# Patient Record
Sex: Male | Born: 1970 | Hispanic: Yes | Marital: Married | Smoking: Never smoker
Health system: Southern US, Community
[De-identification: ages and names within clinical notes are randomized; demographics above are authoritative.]

## PROBLEM LIST (undated history)

## (undated) DIAGNOSIS — E785 Hyperlipidemia, unspecified: Secondary | ICD-10-CM

## (undated) DIAGNOSIS — I10 Essential (primary) hypertension: Secondary | ICD-10-CM

---

## 2015-07-01 ENCOUNTER — Emergency Department (HOSPITAL_COMMUNITY)
Admission: EM | Admit: 2015-07-01 | Discharge: 2015-07-02 | Disposition: A | Discharge status: Home / Self Care | Payer: Self-pay | Attending: Emergency Medicine | Admitting: Emergency Medicine

## 2015-07-01 ENCOUNTER — Emergency Department (HOSPITAL_COMMUNITY): Payer: Self-pay

## 2015-07-01 ENCOUNTER — Encounter (HOSPITAL_COMMUNITY): Payer: Self-pay | Admitting: *Deleted

## 2015-07-01 DIAGNOSIS — Z8639 Personal history of other endocrine, nutritional and metabolic disease: Secondary | ICD-10-CM | POA: Insufficient documentation

## 2015-07-01 DIAGNOSIS — K219 Gastro-esophageal reflux disease without esophagitis: Secondary | ICD-10-CM | POA: Insufficient documentation

## 2015-07-01 DIAGNOSIS — I10 Essential (primary) hypertension: Secondary | ICD-10-CM | POA: Insufficient documentation

## 2015-07-01 HISTORY — DX: Hyperlipidemia, unspecified: E78.5

## 2015-07-01 HISTORY — DX: Essential (primary) hypertension: I10

## 2015-07-01 LAB — BASIC METABOLIC PANEL
Anion gap: 8 (ref 5–15)
BUN: 13 mg/dL (ref 6–20)
CHLORIDE: 105 mmol/L (ref 101–111)
CO2: 27 mmol/L (ref 22–32)
Calcium: 9.4 mg/dL (ref 8.9–10.3)
Creatinine, Ser: 0.81 mg/dL (ref 0.61–1.24)
GFR calc non Af Amer: >60 mL/min (ref >60)
Glucose, Bld: 112 mg/dL — ABNORMAL HIGH (ref 65–99)
POTASSIUM: 3.8 mmol/L (ref 3.5–5.1)
SODIUM: 140 mmol/L (ref 135–145)

## 2015-07-01 LAB — I-STAT TROPONIN, ED
TROPONIN I, POC: 0 ng/mL (ref 0.00–0.08)
Troponin i, poc: 0 ng/mL (ref 0.00–0.08)

## 2015-07-01 LAB — CBC
HEMATOCRIT: 45 % (ref 39.0–52.0)
HEMOGLOBIN: 15.7 g/dL (ref 13.0–17.0)
MCH: 32 pg (ref 26.0–34.0)
MCHC: 34.9 g/dL (ref 30.0–36.0)
MCV: 91.8 fL (ref 78.0–100.0)
Platelets: 195 10*3/uL (ref 150–400)
RBC: 4.9 MIL/uL (ref 4.22–5.81)
RDW: 12.2 % (ref 11.5–15.5)
WBC: 7 10*3/uL (ref 4.0–10.5)

## 2015-07-01 MED ORDER — PANTOPRAZOLE SODIUM 20 MG PO TBEC
20.0000 mg | DELAYED_RELEASE_TABLET | Freq: Every day | ORAL | Status: DC
Start: 2015-07-01 — End: 2017-07-06

## 2015-07-01 MED ORDER — GI COCKTAIL ~~LOC~~: 30.0000 mL | Freq: Two times a day (BID) | ORAL | Status: DC | PRN

## 2015-07-01 MED ORDER — GI COCKTAIL ~~LOC~~
30.0000 mL | Freq: Once | ORAL | Status: AC
  Administered 2015-07-01: 30 mL via ORAL
  Filled 2015-07-01: qty 30

## 2015-07-01 NOTE — ED Notes (Signed)
Pt c/o CP x 3 days with radiation to left arm and back.

## 2015-07-01 NOTE — ED Provider Notes (Signed)
CSN: [id number]     Arrival date & time 07/01/15  1858 History   First MD Initiated Contact with Patient 07/01/15 2221     Chief Complaint  Patient presents with  . Chest Pain     (Consider location/radiation/quality/duration/timing/severity/associated sxs/prior Treatment) Patient is a 44 y.o. male presenting with chest pain. The history is provided by the patient. A language interpreter was used.  Chest Pain Pain location:  Substernal area and L chest Pain quality: burning and sharp   Pain radiates to:  Upper back and L arm Pain radiates to the back: yes   Pain severity:  Moderate Onset quality:  Sudden Duration:  1 minute Timing:  Intermittent Progression:  Unchanged Chronicity:  New Context: eating   Relieved by:  Nothing Exacerbated by: spicy foods and coffee. Ineffective treatments:  None tried Associated symptoms: no abdominal pain, no altered mental status, no anxiety, no diaphoresis, no dizziness, no dysphagia, no fatigue, no headache, no near-syncope and no numbness   Risk factors: high cholesterol, hypertension and male sex   Risk factors: no aortic disease, no diabetes mellitus, not obese, no prior DVT/PE and no smoking     Past Medical History  Diagnosis Date  . Hypertension   . Hyperlipidemia    History reviewed. No pertinent past surgical history. No family history on file. Social History  Substance Use Topics  . Smoking status: Never Smoker   . Smokeless tobacco: None  . Alcohol Use: Yes     Comment: 4 beers daily    Review of Systems  Constitutional: Negative for diaphoresis and fatigue.  HENT: Negative for trouble swallowing.   Cardiovascular: Positive for chest pain. Negative for near-syncope.  Gastrointestinal: Negative for abdominal pain.  Neurological: Negative for dizziness, numbness and headaches.  All other systems reviewed and are negative.     Allergies  Review of patient's allergies indicates no known allergies.  Home Medications    Prior to Admission medications   Not on File   BP 132/74 mmHg  Pulse 76  Temp(Src) 98.6 F (37 C) (Oral)  Resp 16  SpO2 98% Physical Exam  Constitutional: He is oriented to person, place, and time. He appears well-developed and well-nourished. No distress.  HENT:  Head: Normocephalic and atraumatic.  Eyes: Conjunctivae and EOM are normal.  Neck: Normal range of motion.  Cardiovascular: Normal rate and regular rhythm.  Exam reveals no gallop and no friction rub.   No murmur heard. No lower extremity edema or calf tenderness to palpation.   Pulmonary/Chest: Effort normal and breath sounds normal. He has no wheezes. He has no rales. He exhibits no tenderness.  Abdominal: Soft. He exhibits no distension. There is no tenderness. There is no rebound.  Musculoskeletal: Normal range of motion.  Neurological: He is alert and oriented to person, place, and time. Coordination normal.  Speech is goal-oriented. Moves limbs without ataxia.   Skin: Skin is warm and dry.  Psychiatric: He has a normal mood and affect. His behavior is normal.  Nursing note and vitals reviewed.   ED Course  Procedures (including critical care time) Labs Review Labs Reviewed  BASIC METABOLIC PANEL - Abnormal; Notable for the following:    Glucose, Bld 112 (*)    All other components within normal limits  CBC  I-STAT TROPOININ, ED  Rosezena Sensor, ED    Imaging Review Dg Chest 2 View  07/01/2015   CLINICAL DATA:  Three day history of left-sided chest pain radiating to the back associated with  headache, dizziness and blurred vision.  EXAM: CHEST  2 VIEW  COMPARISON:  None.  FINDINGS: Suboptimal inspiration accounts for crowded bronchovascular markings, especially in the bases, and accentuates the cardiac silhouette. Taking this into account, cardiomediastinal silhouette unremarkable. Lungs clear. Bronchovascular markings normal. Pulmonary vascularity normal. No visible pleural effusions. No pneumothorax.  Visualized bony thorax intact.  IMPRESSION: Suboptimal inspiration.  No acute cardiopulmonary disease.   Electronically Signed   By: Hulan Saas M.D.   On: 07/01/2015 19:57   I have personally reviewed and evaluated these images and lab results as part of my medical decision-making.   EKG Interpretation   Date/Time:  Tuesday July 01 2015 19:01:58 EDT Ventricular Rate:  78 PR Interval:  158 QRS Duration: 98 QT Interval:  376 QTC Calculation: 428 R Axis:   73 Text Interpretation:  Normal sinus rhythm Normal ECG No old tracing to  compare Confirmed by [facility]  MD, MARTHA [phone]) on 07/01/2015 10:33:33 PM      MDM   Final diagnoses:  Gastroesophageal reflux disease, esophagitis presence not specified    11:19 PM Patient's labs unremarkable for acute changes. Patient's delta troponin negative. HEART score 2. Patient's history does not sound like cardiac chest pain. Pain appears to be acid reflux. Patient treated with GI cocktail. Patient will be discharged with a list of foods to avoid, GI cocktail, and protonix.    Emilia Beck, PA-C 07/01/15 6045  Jerelyn Scott, MD 07/01/15 [phone]

## 2015-07-01 NOTE — Discharge Instructions (Signed)
Take Protonix daily for your acid reflux. Take GI cocktail as needed for additional relief. Refer to attached documents for more information.

## 2016-08-02 IMAGING — DX DG CHEST 2V
2 series · 2 of 2 positions shown · non-contrast
Comparison: None.

CLINICAL DATA: Three day history of left-sided chest pain radiating
to the back associated with headache, dizziness and blurred vision.

EXAM:
CHEST  2 VIEW

[chest pa]
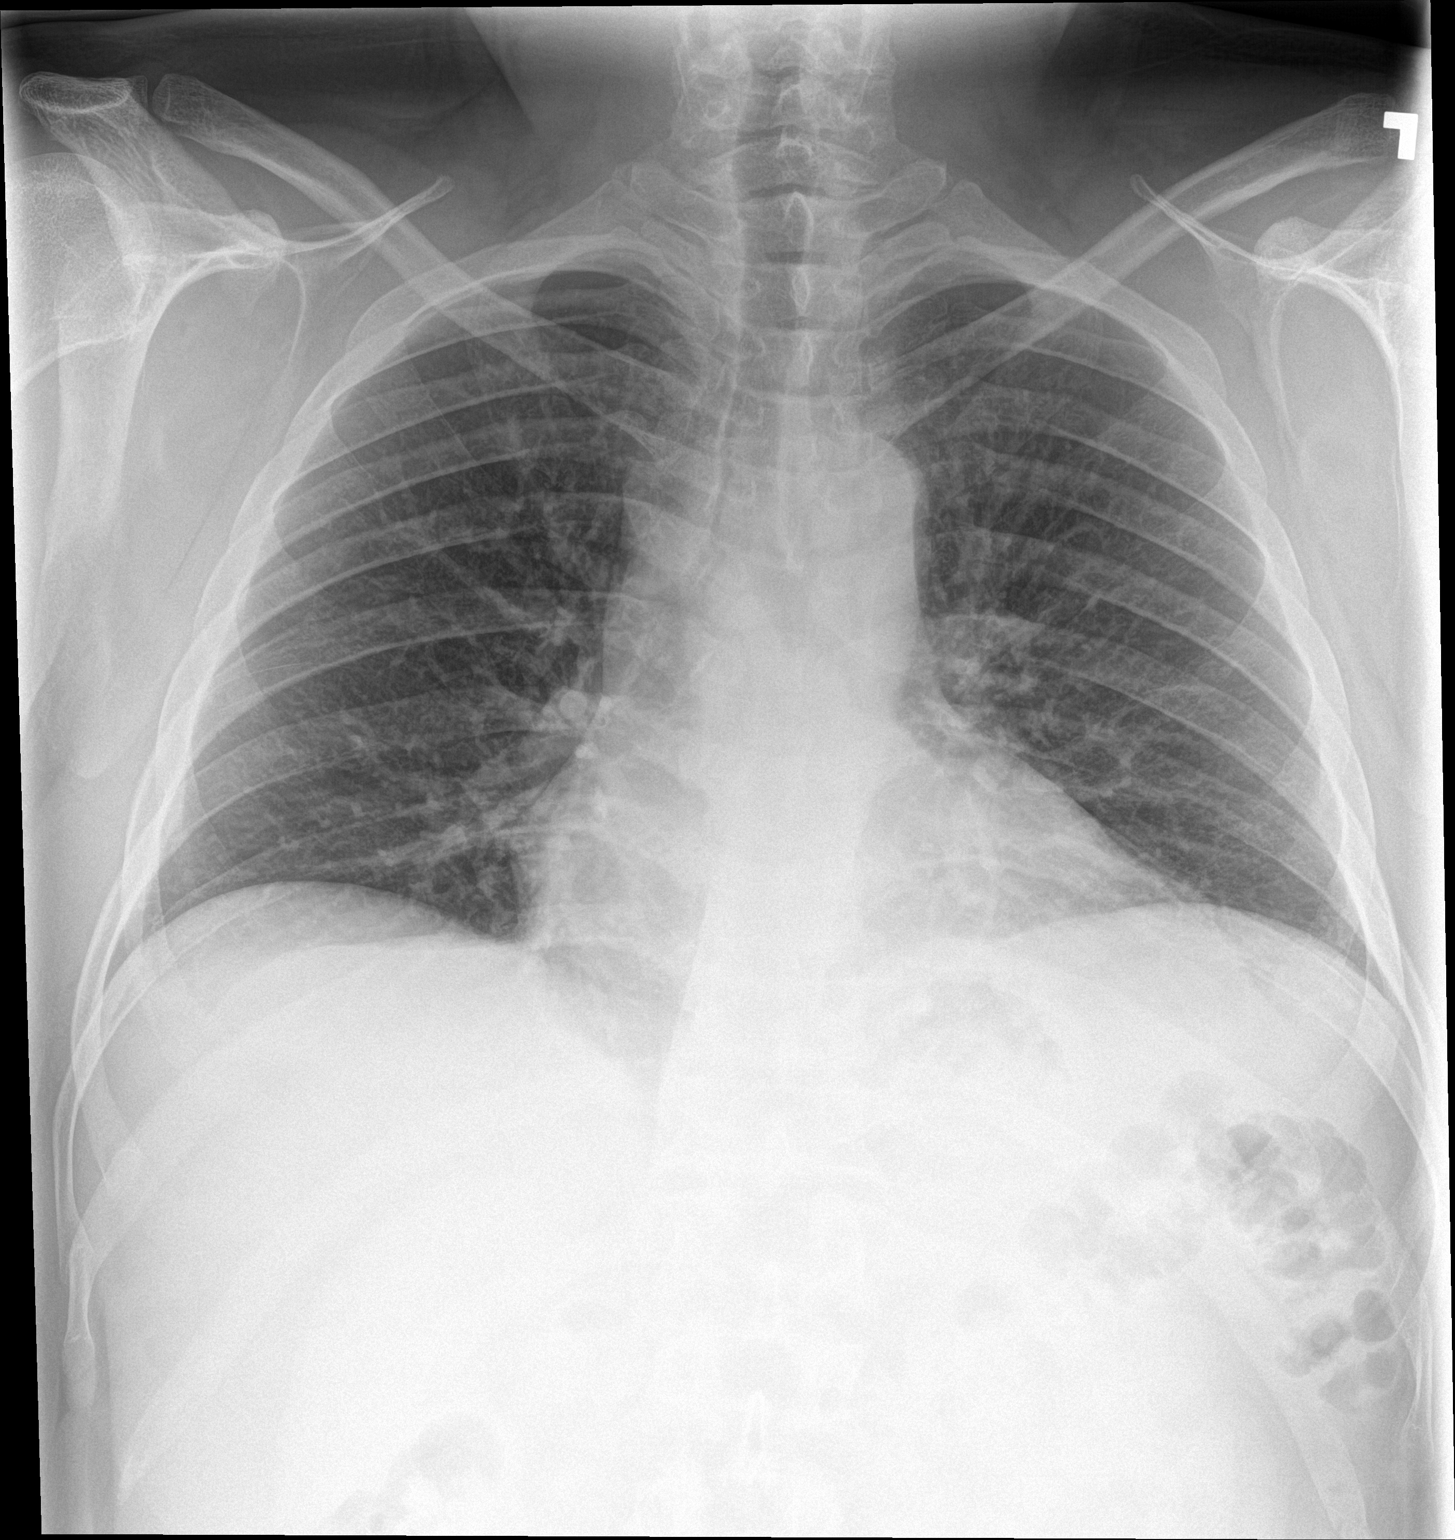

[chest lat]
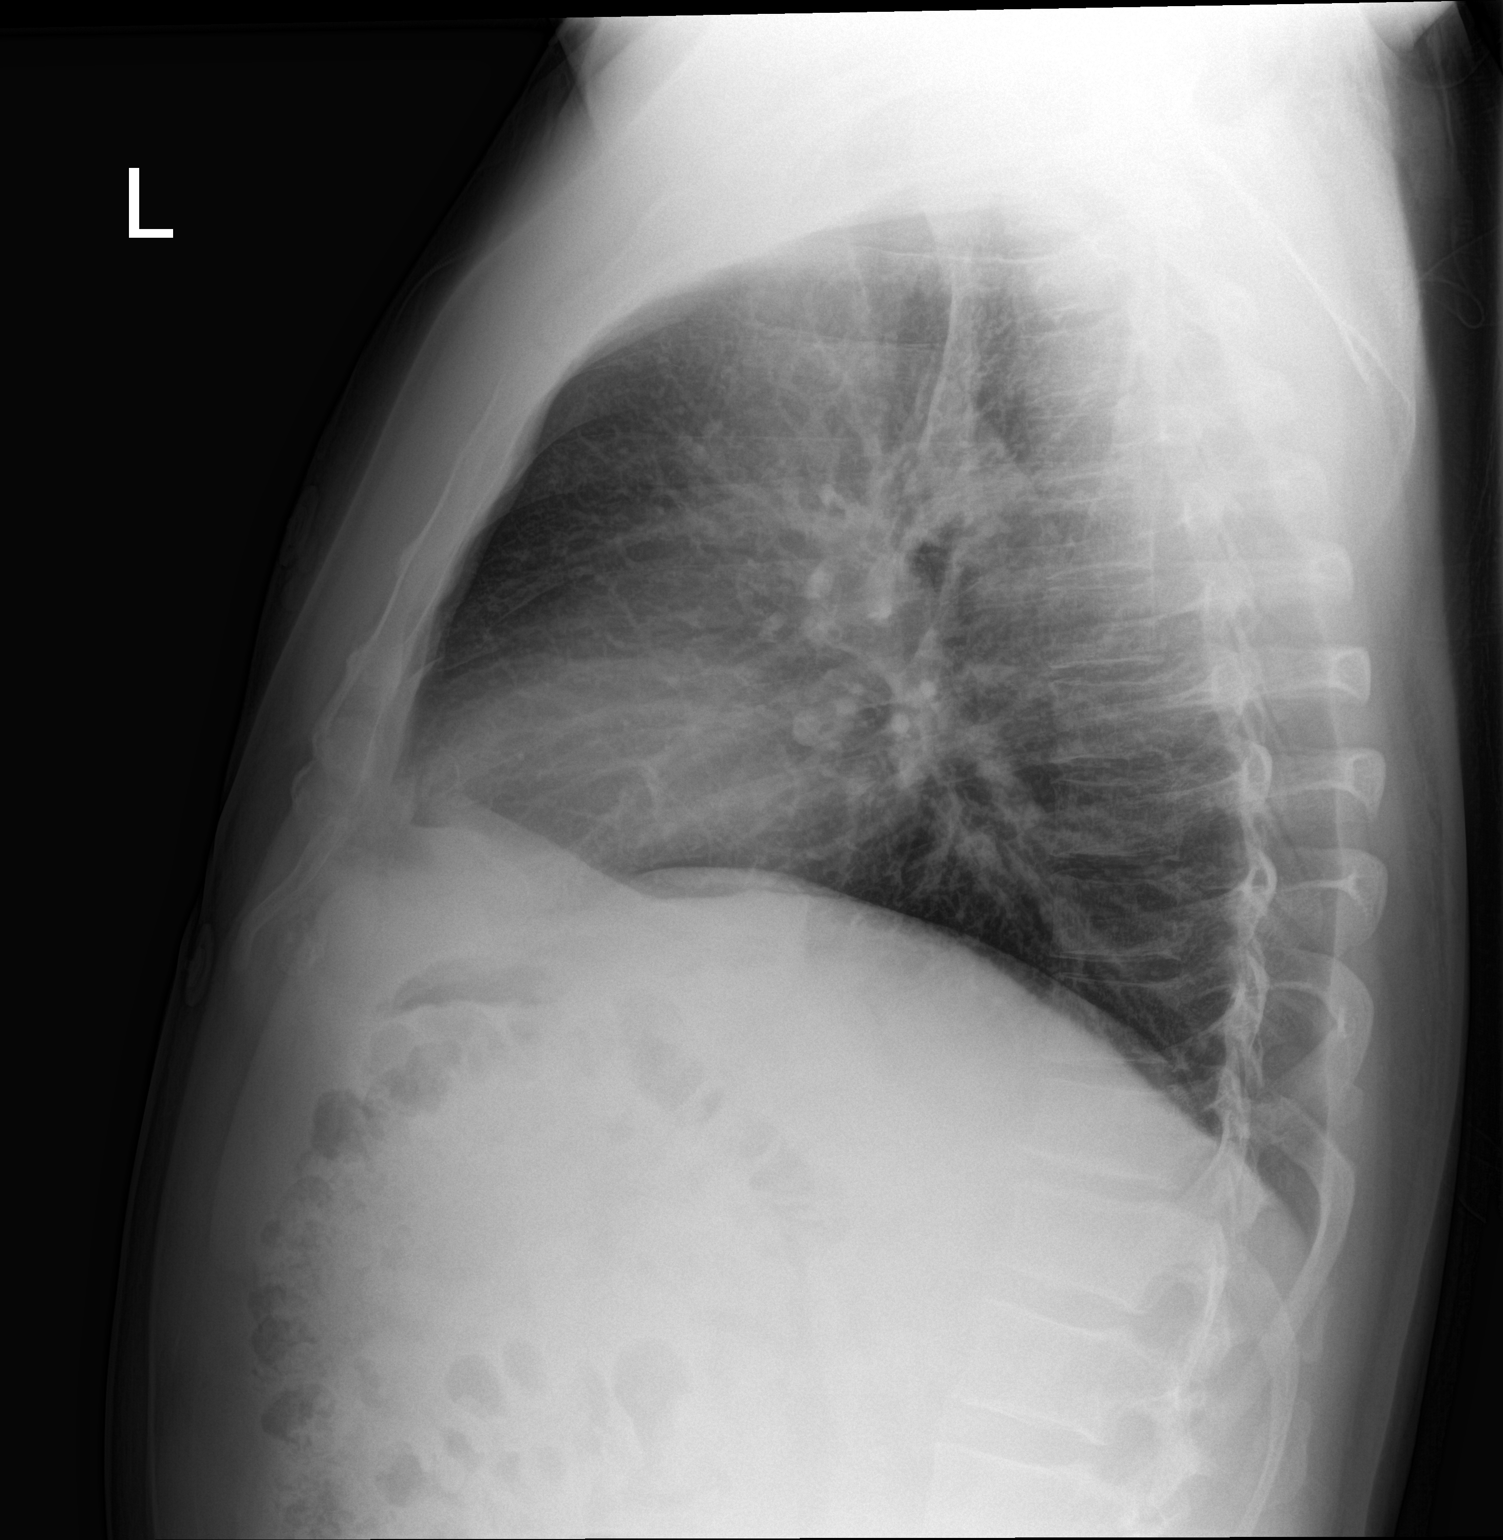

[2 of 2 positions shown; findings below may reference images not displayed]

FINDINGS: Suboptimal inspiration accounts for crowded bronchovascular
markings, especially in the bases, and accentuates the cardiac
silhouette. Taking this into account, cardiomediastinal silhouette
unremarkable. Lungs clear. Bronchovascular markings normal.
Pulmonary vascularity normal. No visible pleural effusions. No
pneumothorax. Visualized bony thorax intact.
IMPRESSION: Suboptimal inspiration.  No acute cardiopulmonary disease.

## 2017-07-06 ENCOUNTER — Encounter: Payer: Self-pay | Admitting: Urgent Care

## 2017-07-06 ENCOUNTER — Ambulatory Visit (INDEPENDENT_AMBULATORY_CARE_PROVIDER_SITE_OTHER): Payer: Self-pay | Admitting: Urgent Care

## 2017-07-06 VITALS — BP 143/82 | HR 81 | Temp 98.3°F | Resp 16 | Ht 63.0 in | Wt 213.4 lb

## 2017-07-06 DIAGNOSIS — E669 Obesity, unspecified: Secondary | ICD-10-CM

## 2017-07-06 DIAGNOSIS — I10 Essential (primary) hypertension: Secondary | ICD-10-CM

## 2017-07-06 DIAGNOSIS — Z6837 Body mass index (BMI) 37.0-37.9, adult: Secondary | ICD-10-CM

## 2017-07-06 DIAGNOSIS — R519 Headache, unspecified: Secondary | ICD-10-CM

## 2017-07-06 DIAGNOSIS — R030 Elevated blood-pressure reading, without diagnosis of hypertension: Secondary | ICD-10-CM

## 2017-07-06 DIAGNOSIS — R42 Dizziness and giddiness: Secondary | ICD-10-CM

## 2017-07-06 DIAGNOSIS — E860 Dehydration: Secondary | ICD-10-CM

## 2017-07-06 DIAGNOSIS — R7303 Prediabetes: Secondary | ICD-10-CM

## 2017-07-06 DIAGNOSIS — R51 Headache: Secondary | ICD-10-CM

## 2017-07-06 LAB — POCT URINALYSIS DIP (MANUAL ENTRY)
BILIRUBIN UA: NEGATIVE
Blood, UA: NEGATIVE
GLUCOSE UA: NEGATIVE mg/dL
Ketones, POC UA: NEGATIVE mg/dL
LEUKOCYTES UA: NEGATIVE
NITRITE UA: NEGATIVE
Protein Ur, POC: 30 mg/dL — AB
Spec Grav, UA: >=1.03 — AB (ref 1.010–1.025)
Urobilinogen, UA: 1 E.U./dL
pH, UA: 6 (ref 5.0–8.0)

## 2017-07-06 LAB — POCT CBC
Granulocyte percent: 64.8 %G (ref 37–80)
HEMATOCRIT: 46 % (ref 43.5–53.7)
HEMOGLOBIN: 15.5 g/dL (ref 14.1–18.1)
LYMPH, POC: 2.8 (ref 0.6–3.4)
MCH: 30.8 pg (ref 27–31.2)
MCHC: 33.8 g/dL (ref 31.8–35.4)
MCV: 91.1 fL (ref 80–97)
MID (CBC): 0.1 (ref 0–0.9)
MPV: 7.7 fL (ref 0–99.8)
POC GRANULOCYTE: 5.4 (ref 2–6.9)
POC LYMPH PERCENT: 33.4 %L (ref 10–50)
POC MID %: 1.8 % (ref 0–12)
Platelet Count, POC: 218 10*3/uL (ref 142–424)
RBC: 5.04 M/uL (ref 4.69–6.13)
RDW, POC: 12.3 %
WBC: 83 10*3/uL — AB (ref 4.6–10.2)

## 2017-07-06 LAB — POCT GLYCOSYLATED HEMOGLOBIN (HGB A1C): Hemoglobin A1C: 6

## 2017-07-06 MED ORDER — AMLODIPINE BESYLATE 5 MG PO TABS: 5.0000 mg | ORAL_TABLET | Freq: Every day | ORAL | 1 refills | Status: AC

## 2017-07-06 MED ORDER — NAPROXEN SODIUM 550 MG PO TABS: 550.0000 mg | ORAL_TABLET | Freq: Two times a day (BID) | ORAL | 1 refills | Status: AC

## 2017-07-06 NOTE — Progress Notes (Signed)
MRN: [id number] DOB: [birth year]  Subjective:   Marcus Newman is a 46 y.o. male presenting for chief complaint of Headache  Reports 3 day history of dull posterior headache with mild associated neck pain. Also reports mild dizziness, sluggish eyes, mild blurred vision. Has tried Advil with some relief. Denies fever, confusion, weakness, sinus congestion, sinus pain, ear pain, ear drainage, cough, sore throat, n/v, chest pain, abdominal pain, rashes. Denies smoking cigarettes. Drinks 6 pack at a time daily. Diet is not healthy. Drinks 4-5 bottles of water, works in Holiday representative. Sleeps ~8 hours nightly. Has a history of pre-diabetes, HL, HTN. Used to be on medications for HL, HTN but stopped taking them after 3 months as instructed by his PCP at the time. Admits intermittent tingling of his hands, polyuria (urinates 7+ times daily). Denies polydipsia, polyphagia, weight loss.  Marcus Newman is not currently taking any medications. Also has No Known Allergies.  Marcus Newman  has a past medical history of Hyperlipidemia and Hypertension. Denies past surgical history. Family history is positive for diabetes with his mother and sisters.  Objective:   Vitals: BP (!) 143/82 (BP Location: Right Arm, Patient Position: Sitting, Cuff Size: Large)   Pulse 81   Temp 98.3 F (36.8 C) (Oral)   Resp 16   Ht 5\' 3"  (1.6 m)   Wt 213 lb 6.4 oz (96.8 kg)   SpO2 97%   BMI 37.80 kg/m   BP Readings from Last 3 Encounters:  07/06/17 (!) 143/82  07/02/15 139/84   No exam data present   Physical Exam  Constitutional: He is oriented to person, place, and time. He appears well-developed and well-nourished.  HENT:  TM's intact bilaterally, no effusions or erythema. Nasal turbinates pink and moist, nasal passages patent. No sinus tenderness. Oropharynx clear, mucous membranes moist.  Eyes: No scleral icterus.  Neck: Normal range of motion. Neck supple.  Cardiovascular: Normal rate, regular rhythm and intact distal  pulses.  Exam reveals no gallop and no friction rub.   No murmur heard. Pulmonary/Chest: No respiratory distress. He has no wheezes. He has no rales.  Abdominal: Soft. Bowel sounds are normal. He exhibits no distension and no mass. There is no tenderness. There is no guarding.  Musculoskeletal: Normal range of motion. He exhibits no edema.  Neurological: He is alert and oriented to person, place, and time. He displays normal reflexes. No cranial nerve deficit. Coordination normal.  Skin: Skin is warm and dry.  Psychiatric: He has a normal mood and affect.   Results for orders placed or performed in visit on 07/06/17 (from the past 24 hour(s))  POCT CBC     Status: Abnormal   Collection Time: 07/06/17  4:05 PM  Result Value Ref Range   WBC 83 (A) 4.6 - 10.2 K/uL   Lymph, poc 2.8 0.6 - 3.4   POC LYMPH PERCENT 33.4 10 - 50 %L   MID (cbc) 0.1 0 - 0.9   POC MID % 1.8 0 - 12 %M   POC Granulocyte 5.4 2 - 6.9   Granulocyte percent 64.8 37 - 80 %G   RBC 5.04 4.69 - 6.13 M/uL   Hemoglobin 15.5 14.1 - 18.1 g/dL   HCT, POC 40.9 81.1 - 53.7 %   MCV 91.1 80 - 97 fL   MCH, POC 30.8 27 - 31.2 pg   MCHC 33.8 31.8 - 35.4 g/dL   RDW, POC 91.4 %   Platelet Count, POC 218 142 - 424 K/uL   MPV 7.7 0 -  99.8 fL  POCT urinalysis dipstick     Status: Abnormal   Collection Time: 07/06/17  4:07 PM  Result Value Ref Range   Color, UA yellow yellow   Clarity, UA clear clear   Glucose, UA negative negative mg/dL   Bilirubin, UA negative negative   Ketones, POC UA negative negative mg/dL   Spec Grav, UA >=6.269 (A) 1.010 - 1.025   Blood, UA negative negative   pH, UA 6.0 5.0 - 8.0   Protein Ur, POC =30 (A) negative mg/dL   Urobilinogen, UA 1.0 0.2 or 1.0 E.U./dL   Nitrite, UA Negative Negative   Leukocytes, UA Negative Negative  POCT glycosylated hemoglobin (Hb A1C)     Status: None   Collection Time: 07/06/17  4:09 PM  Result Value Ref Range   Hemoglobin A1C 6.0    Assessment and Plan :   1.  Generalized headaches 2. Dizziness 3. Dehydration - Labs and physical exam reassuring. Counseled patient on adequate hydration and cutting back on his alcohol use. He is also to use naproxen as needed for headaches. Return-to-clinic precautions discussed, patient verbalized understanding.   4. Class 2 obesity without serious comorbidity with body mass index (BMI) of 37.0 to 37.9 in adult, unspecified obesity type 5. Pre-diabetes - Recommended lifestyle modifications.  6. Essential hypertension 7. Elevated blood pressure reading - Restarted medical therapy. Follow up in 4 weeks.  Wallis Bamberg, PA-C [facility] [phone] 07/06/2017  3:37 PM

## 2017-07-06 NOTE — Patient Instructions (Addendum)
Deshidratacin en los adultos (Dehydration, Adult) La deshidratacin es un cuadro clnico que se produce cuando no se tiene la cantidad suficiente de lquido o de agua en el organismo. Se produce cuando se toma menos lquido del que se pierde. Los rganos [employer] Corporation riones, el cerebro y el [place], no pueden funcionar sin una cantidad [place] de agua y Airline pilot. Cualquier prdida de lquidos del organismo puede causar deshidratacin.  La deshidratacin puede ser leve o grave. Este cuadro clnico se debe tratar de inmediato para evitar que se agrave. CAUSAS  Este cuadro clnico puede deberse a lo siguiente:  Vmitos.  Diarrea.  Exceso de sudoracin, por ejemplo, al realizar actividad fsica cuando hace calor o hay humedad.  No beber la cantidad suficiente de lquido mientras realizan actividad fsica extenuante o cuando estn enfermos.  Excesiva eliminacin de [place].  Grant Ruts.  Algunos medicamentos. FACTORES DE RIESGO Es ms probable que este cuadro clnico se [employer]ys Corporation en:  Las personas que estn tomando determinados medicamentos que causan la prdida excesiva de lquido del organismo (diurticos).   Las personas que sufren una enfermedad crnica, como diabetes, que puede aumentar la miccin.  Adultos mayores.   Las personas que viven a Development worker, international aid.   Las personas que practican deportes de resistencia.  SNTOMAS  Deshidratacin leve  Sed.  Labios secos.  Sequedad leve en la boca.  Piel seca y caliente. Deshidratacin [employer].   Calambres musculares.   Larose Kells y disminucin de la produccin de [place].   Disminucin de la produccin de lgrimas.   Dolor de [place].   Sensacin de desvanecimiento, especialmente al ponerse de pie.  Deshidratacin grave  Cambios en la piel.  ? Piel fra y hmeda.  ? La piel no vuelve rpidamente a su lugar cuando se la suelta luego de pellizcarla ligeramente.   Cambios en los lquidos  corporales.  ? Sed extrema.  ? Falta de lgrimas.  ? Imposibilidad de transpirar cuando la temperatura corporal es alta, por ejemplo, cuando hace calor.  ? Mnima produccin de [place].   Cambios en las constantes vitales.  ? Pulso rpido y dbil (ms de 100pulsaciones por minuto cuando est quieto).  ? Respiracin rpida.  ? Presin arterial baja.   Otros cambios.  ? Ojos hundidos.  ? Manos y pies fros.  ? Confusin. ? Aletargamiento y dificultad para mantenerse despierto. ? Desmayos (sncope).  ? Prdida de peso a Product manager.  ? Prdida del conocimiento. DIAGNSTICO  Este cuadro clnico se puede diagnosticar en funcin de los sntomas. Tambin se pueden hacer estudios para determinar la gravedad de la deshidratacin. Estos estudios pueden Johnson [employer] Johnson siguientes:   Anlisis de [place].   Anlisis de [place].  TRATAMIENTO  El tratamiento de este cuadro clnico depende de la gravedad. La deshidratacin leve o moderada a menudo puede tratarse en la casa. El tratamiento se debe comenzar de inmediato. No espere hasta que la deshidratacin sea grave. La deshidratacin grave debe ser tratada en el hospital. Tratamiento para la deshidratacin leve  Beber abundante agua para reemplazar el lquido que se perdi.   Reemplazar los minerales en la sangre (electrolitos) que se pueden haber perdido.  Tratamiento para la deshidratacin moderada  Consumir una solucin de rehidratacin oral (SRO). Tratamiento para la deshidratacin grave  Recibir lquidos a travs de una va intravenosa (IV).   Recibir una solucin de Customer service manager a travs de una sonda de alimentacin que se coloca a travs de la nariz Mudlogger (sonda nasogstrica  o sonda NG).  Corregir las ONEOK. INSTRUCCIONES PARA EL CUIDADO EN EL HOGAR   Beba suficiente lquido para mantener la orina clara o de color amarillo plido.   Beba lentamente pequeos sorbos de agua o de  lquido. Tambin puede chupar cubos de hielo.  Consuma alimentos o bebidas que contengan electrolitos. Com[place] por ejemplo, bananas y bebidas deportivas.  Tome los medicamentos de venta libre y los recetados solamente como se lo haya indicado el mdico.   Prepare la solucin de rehidratacin oral de acuerdo con las indicaciones del fabricante. Tome sorbos de la solucin de rehidratacin oral cada hasta que la orina se normalice.  Si tiene vmitos o diarrea, siga tratando de beber agua, una solucin de rehidratacin oral o ambos.   Si tiene diarrea, debe evitar lo siguiente:  ? Las bebidas que contengan cafena.  ? El jugo de frutas.  ? Motorola.  ? Las American Electric Power.  No tome comprimidos de sal. Esto puede causar la acumulacin excesiva de sodio en el organismo (hipernatremia).  SOLICITE ATENCIN MDICA SI:  No puede comer ni beber sin vomitar.  Ha tenido diarrea moderada durante ms de 24horas.  Tiene fiebre. SOLICITE ATENCIN MDICA DE INMEDIATO SI:   Siente sed extrema.  Tiene una diarrea intensa.  No ha orinado durante 6 a 8horas o solo ha orinado una cantidad pequea de [place].  Tiene la piel arrugada.  Est mareado, confundido o tiene ambos sntomas. Esta informacin no tiene Theme park manager el consejo del mdico. Asegrese de hacerle al mdico cualquier pregunta que tenga. Document Released: 11/01/2005 Document Revised: 07/23/2015 Elsevier Interactive Patient Education  2017 Elsevier Inc.     Prediabetes (Prediabetes) QU ES LA PREDIABETES? La prediabetes es la enfermedad que presenta un nivel de azcar en la sangre (glucemia) ms alto de lo normal, pero no lo suficientemente alto como para que le diagnostiquen diabetes tipo2. El hecho de ser prediabtico lo pone en riesgo de desarrollar diabetes tipo2 (diabetes mellitus tipo2). La prediabetes tambin se puede llamar intolerancia a la glucosa o glucosa alterada en  ayunas. Generalmente, la prediabetes no causa sntomas. El mdico puede diagnosticar esta enfermedad por los anlisis de [place]. Los anlisis para Engineer, manufacturing la prediabetes se pueden realizar si usted tiene sobrepeso y si presenta al menos un factor de riesgo ms de prediabetes. Entre los factores de riesgo de prediabetes, se incluyen los siguientes:  Warehouse manager un familiar con diabetes tipo2.  Sobrepeso u obesidad.  Tener ms de 45 aos.  Ser descendiente de indgenas norteamericanos, afroamericanos, hispanos o latinos, o asiticos o isleos del Pacfico.  Tener un estilo de vida inactivo (sedentario).  Tener antecedentes de diabetes gestacional o sndrome de ovario poliqustico (SOP).  Tener niveles bajos del colesterol bueno (HDL-C) o niveles altos de grasas en la sangre (triglicridos).  Tener hipertensin arterial. QU ES LA GLUCEMIA Y CMO SE MIDE? La glucemia hace referencia a la cantidad de glucosa que tiene en el torrente sanguneo. La glucosa proviene de los alimentos que contienen azcar y almidn (carbohidratos) que el organismo descompone para formar glucosa. El nivel de glucemia se puede medir en mg/dl (miligramos por decilitro) o mmol/l (milimoles por litro).La glucemia puede controlarse con uno o ms de los siguientes anlisis de sangre:  Medicin de la glucemia en [place]. No se le permitir comer (tendr que [employer] Energy) durante al menos 8horas antes de que se tome una Dillon de [place]. ? Un rango normal de glucemia en ayunas es de  70 a 100mg /dl (de 3,9 a 7,8GNFA/O).  Un anlisis de sangre de A1c (hemoglobina A1c). Este anlisis proporciona informacin sobre el control de la glucemia durante los ltimos 2 o .  Prueba de tolerancia a la glucosa oral (PTGO). Esta prueba mide la glucemia dos veces: ? Despus del ayuno. Este es el valor inicial. ? Dos horas despus de ingerir una bebida que contiene glucosa. Pueden diagnosticarle prediabetes en los siguientes  casos:  Si la glucemia en ayunas es de 100 a 125mg /dl (de 5,6 a 1,3YQMV/H).  Si el nivel de A1c es del 5,7% al 6,4%.  Si el resultado de la PTGO es de 140 a 199mg /dl (de 7,8 a 84ONGE/X). Estos anlisis de sangre se pueden repetir para Pharmacist, hospital diagnstico. QU SUCEDE SI LA GLUCEMIA ES DEMASIADO ALTA? El pncreas produce una hormona (insulina) que ayuda a Merchant navy officer glucosa desde el torrente sanguneo hacia las clulas. Cuando las clulas no responden de forma [place] a la insulina que el organismo produce (resistencia a la insulina), el exceso de glucosa se acumula en la sangre en vez de dirigirse hacia las clulas. Como consecuencia, se puede desarrollar glucemia alta (hiperglucemia), que puede causar muchas complicaciones. Este es uno de los sntomas de la prediabetes. QU PUEDE SUCEDER SI LA GLUCEMIA PERMANECE MS ALTA DE LO NORMAL DURANTE MUCHO TIEMPO? Es peligroso Public librarian glucemia alta durante mucho tiempo. Demasiada glucosa en la sangre puede daar los nervios y los vasos sanguneos. El dao a largo plazo puede provocar complicaciones de la diabetes, por ejemplo:  Cardiopata.  Ictus.  Ceguera.  Enfermedad renal.  Depresin.  Mala circulacin en los pies y en las piernas, que podra llevar a la extraccin quirrgica (amputacin) en casos graves. CMO SE PUEDE EVITAR QUE LA PREDIABETES SE CONVIERTA EN DIABETES TIPO2? Para prevenir la diabetes tipo2, tome las siguientes medidas:  Haga actividad fsica. ? Haga actividad fsica de intensidad moderada durante al menos como mnimo 5das por [employer], o tanto como le haya indicado el mdico. Podra hacer caminatas dinmicas, ciclismo o [place]. ? Pregntele al mdico qu actividades son seguras para usted. Una combinacin de actividades puede ser la mejor opcin, por ejemplo, caminar, practicar natacin, andar en bicicleta y hacer entrenamiento de fuerza.  Baje de General Electric se lo haya indicado el  mdico. ? Bajar entre el 5% y el 7% del peso corporal puede revertir la resistencia a la insulina. ? El mdico puede determinar cuntos kilos tiene que bajar y [place] a que adelgace de Wellsite geologist segura.  Siga un plan de alimentacin saludable. Este incluye consumir protenas magras, hidratos de carbono complejos, frutas y verduras frescas, productos lcteos con bajo contenido de grasa y grasas saludables. ? Siga las indicaciones del mdico respecto de las restricciones para las comidas o las bebidas. ? Programe una cita con un especialista en alimentacin y nutricin (nutricionista certificado) para que lo ayude a Quarry manager plan de alimentacin saludable adecuado para usted.  No fume ni consuma ningn producto que contenga tabaco, lo que incluye cigarrillos, tabaco de Theatre manager y Administrator, Civil Service. Si necesita ayuda para dejar de fumar, consulte al American Express.  Baxter International de venta libre y los recetados como se lo haya indicado el mdico. Es posible que le receten medicamentos que ayuden a disminuir el riesgo de tener diabetes tipo2. Esta informacin no tiene Theme park manager el consejo del mdico. Asegrese de hacerle al mdico cualquier pregunta que tenga. Document Released: 12/23/2015 Document Revised: 12/23/2015 Document Reviewed: 12/23/2015 Elsevier Interactive Patient  Education  [employer].     IF you received an x-ray today, you will receive an invoice from [facility]. Please contact [facility] at [phone] with questions or concerns regarding your invoice.   IF you received labwork today, you will receive an invoice from [place]. Please contact [facility] at [phone] with questions or concerns regarding your invoice.   Our billing staff will not be able to assist you with questions regarding bills from these companies.  You will be contacted with the lab results as soon as they are available. The fastest way to get your results is to  activate your My Chart account. Instructions are located on the last page of this paperwork. If you have not heard from [place] regarding the results in 2 weeks, please contact this office.

## 2017-07-07 LAB — BASIC METABOLIC PANEL
BUN/Creatinine Ratio: 17 (ref 9–20)
BUN: 15 mg/dL (ref 6–24)
CO2: 23 mmol/L (ref 20–29)
CREATININE: 0.86 mg/dL (ref 0.76–1.27)
Calcium: 9.8 mg/dL (ref 8.7–10.2)
Chloride: 102 mmol/L (ref 96–106)
GFR, EST AFRICAN AMERICAN: 119 mL/min/{1.73_m2} (ref >59)
GFR, EST NON AFRICAN AMERICAN: 103 mL/min/{1.73_m2} (ref >59)
Glucose: 99 mg/dL (ref 65–99)
POTASSIUM: 4.4 mmol/L (ref 3.5–5.2)
SODIUM: 142 mmol/L (ref 134–144)

## 2019-12-04 ENCOUNTER — Ambulatory Visit: Payer: Self-pay | Attending: Internal Medicine

## 2019-12-04 DIAGNOSIS — Z20822 Contact with and (suspected) exposure to covid-19: Secondary | ICD-10-CM | POA: Insufficient documentation

## 2019-12-05 LAB — NOVEL CORONAVIRUS, NAA: SARS-CoV-2, NAA: NOT DETECTED
